# Patient Record
Sex: Female | Born: 1965 | Race: White | Hispanic: No | Marital: Married | Smoking: Never smoker
Health system: Southern US, Community
[De-identification: ages and names within clinical notes are randomized; demographics above are authoritative.]

## PROBLEM LIST (undated history)

## (undated) HISTORY — PX: TUBAL LIGATION: SHX77

---

## 1999-02-05 ENCOUNTER — Other Ambulatory Visit: Admission: RE | Admit: 1999-02-05 | Discharge: 1999-02-05 | Payer: Self-pay | Admitting: Obstetrics and Gynecology

## 1999-08-20 ENCOUNTER — Encounter: Admission: RE | Admit: 1999-08-20 | Discharge: 1999-11-18 | Payer: Self-pay | Admitting: Gynecology

## 2000-01-04 ENCOUNTER — Inpatient Hospital Stay (HOSPITAL_COMMUNITY): Admission: AD | Admit: 2000-01-04 | Discharge: 2000-01-07 | Payer: Self-pay | Admitting: Gynecology

## 2000-01-04 ENCOUNTER — Encounter (INDEPENDENT_AMBULATORY_CARE_PROVIDER_SITE_OTHER): Payer: Self-pay

## 2000-02-17 ENCOUNTER — Other Ambulatory Visit: Admission: RE | Admit: 2000-02-17 | Discharge: 2000-02-17 | Payer: Self-pay | Admitting: Gynecology

## 2001-02-01 HISTORY — PX: ABDOMINOPLASTY: SUR9

## 2001-02-16 ENCOUNTER — Other Ambulatory Visit: Admission: RE | Admit: 2001-02-16 | Discharge: 2001-02-16 | Payer: Self-pay | Admitting: Internal Medicine

## 2001-11-06 ENCOUNTER — Ambulatory Visit (HOSPITAL_COMMUNITY): Admission: RE | Admit: 2001-11-06 | Discharge: 2001-11-06 | Payer: Self-pay | Admitting: Gynecology

## 2002-06-05 ENCOUNTER — Other Ambulatory Visit: Admission: RE | Admit: 2002-06-05 | Discharge: 2002-06-05 | Payer: Self-pay | Admitting: Gynecology

## 2002-07-11 ENCOUNTER — Encounter: Admission: RE | Admit: 2002-07-11 | Discharge: 2002-07-11 | Payer: Self-pay

## 2003-01-10 ENCOUNTER — Encounter: Admission: RE | Admit: 2003-01-10 | Discharge: 2003-01-10 | Payer: Self-pay | Admitting: Gynecology

## 2003-06-13 ENCOUNTER — Other Ambulatory Visit: Admission: RE | Admit: 2003-06-13 | Discharge: 2003-06-13 | Payer: Self-pay | Admitting: Gynecology

## 2004-02-07 ENCOUNTER — Encounter: Admission: RE | Admit: 2004-02-07 | Discharge: 2004-02-07 | Payer: Self-pay | Admitting: Gynecology

## 2004-06-15 ENCOUNTER — Other Ambulatory Visit: Admission: RE | Admit: 2004-06-15 | Discharge: 2004-06-15 | Payer: Self-pay | Admitting: Gynecology

## 2005-03-18 ENCOUNTER — Encounter: Admission: RE | Admit: 2005-03-18 | Discharge: 2005-03-18 | Payer: Self-pay | Admitting: Gynecology

## 2005-11-10 ENCOUNTER — Other Ambulatory Visit: Admission: RE | Admit: 2005-11-10 | Discharge: 2005-11-10 | Payer: Self-pay | Admitting: Gynecology

## 2006-05-02 ENCOUNTER — Encounter: Admission: RE | Admit: 2006-05-02 | Discharge: 2006-05-02 | Payer: Self-pay | Admitting: Gynecology

## 2007-01-06 ENCOUNTER — Other Ambulatory Visit: Admission: RE | Admit: 2007-01-06 | Discharge: 2007-01-06 | Payer: Self-pay | Admitting: Gynecology

## 2007-05-24 ENCOUNTER — Encounter: Admission: RE | Admit: 2007-05-24 | Discharge: 2007-05-24 | Payer: Self-pay | Admitting: Gynecology

## 2007-09-06 ENCOUNTER — Other Ambulatory Visit: Admission: RE | Admit: 2007-09-06 | Discharge: 2007-09-06 | Payer: Self-pay | Admitting: Gynecology

## 2008-01-08 ENCOUNTER — Ambulatory Visit: Payer: Self-pay | Admitting: Women's Health

## 2008-01-08 ENCOUNTER — Encounter: Payer: Self-pay | Admitting: Women's Health

## 2008-01-08 ENCOUNTER — Other Ambulatory Visit: Admission: RE | Admit: 2008-01-08 | Discharge: 2008-01-08 | Payer: Self-pay | Admitting: Gynecology

## 2008-05-24 ENCOUNTER — Encounter: Admission: RE | Admit: 2008-05-24 | Discharge: 2008-05-24 | Payer: Self-pay | Admitting: Gynecology

## 2009-04-02 ENCOUNTER — Other Ambulatory Visit: Admission: RE | Admit: 2009-04-02 | Discharge: 2009-04-02 | Payer: Self-pay | Admitting: Gynecology

## 2009-04-02 ENCOUNTER — Ambulatory Visit: Payer: Self-pay | Admitting: Women's Health

## 2009-05-26 ENCOUNTER — Encounter: Admission: RE | Admit: 2009-05-26 | Discharge: 2009-05-26 | Payer: Self-pay | Admitting: Gynecology

## 2010-04-06 ENCOUNTER — Encounter (INDEPENDENT_AMBULATORY_CARE_PROVIDER_SITE_OTHER): Payer: 59 | Admitting: Women's Health

## 2010-04-06 ENCOUNTER — Other Ambulatory Visit: Payer: Self-pay | Admitting: Women's Health

## 2010-04-06 ENCOUNTER — Other Ambulatory Visit (HOSPITAL_COMMUNITY)
Admission: RE | Admit: 2010-04-06 | Discharge: 2010-04-06 | Disposition: A | Discharge status: Home / Self Care | Payer: 59 | Source: Ambulatory Visit | Attending: Gynecology | Admitting: Gynecology

## 2010-04-06 DIAGNOSIS — N926 Irregular menstruation, unspecified: Secondary | ICD-10-CM

## 2010-04-06 DIAGNOSIS — Z124 Encounter for screening for malignant neoplasm of cervix: Secondary | ICD-10-CM | POA: Insufficient documentation

## 2010-04-06 DIAGNOSIS — N912 Amenorrhea, unspecified: Secondary | ICD-10-CM

## 2010-04-06 DIAGNOSIS — Z01419 Encounter for gynecological examination (general) (routine) without abnormal findings: Secondary | ICD-10-CM

## 2010-04-29 ENCOUNTER — Other Ambulatory Visit: Payer: Self-pay | Admitting: Gynecology

## 2010-04-29 DIAGNOSIS — Z1231 Encounter for screening mammogram for malignant neoplasm of breast: Secondary | ICD-10-CM

## 2010-05-28 ENCOUNTER — Ambulatory Visit: Payer: 59

## 2010-06-19 NOTE — Discharge Summary (Signed)
[facility]  Patient:    Catherine English, Catherine English                           MRN: [id number] Adm. Date:  [id number] Disc. Date: [id number] Attending:  Tonye Royalty Dictator:   Antony Contras, [facility]                           Discharge Summary  DISCHARGE DIAGNOSES:          1. Intrauterine pregnancy at 40+ weeks.                               2. Severe oligohydramnios.                               3. Right renal fetal hydronephrosis, grade 4.  PROCEDURES:                   Low cervical transverse cesarean section with delivery of viable infant secondary to fetal distress, severe bradycardia.  HISTORY OF PRESENT ILLNESS:   Patient is a 45 year old G3, P2, 41 with a corrected EDC of December 24, 1999.  Prenatal risk factors include history of advanced maternal age.  Patient did decline an amniocentesis and MSAFP. Severe oligohydramnios at 41-4/7 weeks.  Also right hydronephrosis grade 4 of the fetal kidney, left renal pelvis otherwise normal.  PRENATAL LABORATORY:          Blood type 0 positive.  Antibody screen negative.  RPR, HBsAg, HIV nonreactive.  Rubella immune.  GBS was positive.  HOSPITAL COURSE:              Patient was admitted at 41-4/7 weeks on January 04, 2000 secondary to severe oligohydramnios.  She was admitted for induction of labor and received GBS prophylaxis.  Amnio infusion also was initiated.  She did experience variable decelerations and also a late acceleration with prolonged bradycardia lasting 6-8 minutes.  Cervical dilatation at this point was 4-5 cm, 90%, -2 station.  It was decided to proceed with cesarean section secondary to fetal distress.  This was performed under epidural anesthesia.  The patient was delivered of an Apgar 9 and 9, 9 pounds 7 ounce female infant.  Pelvic anatomy was normal.  She did experience some mild uterine adenine and was given Methergine 0.2 mg a.m.  POST PARTUM COURSE:           Patient remained  afebrile.  She did complain of some right shoulder pain of which she described as an achy sensation but there was no limitation of movement.  She was able to be discharged on her second postoperative day in satisfactory condition.  DISCHARGE LABORATORY:         CBC:  Hematocrit 32.8, hemoglobin 11.4, WBC 15, 10.9, platelets 146,000.  FOLLOW-UP:                    Six weeks.  DISCHARGE INSTRUCTIONS:       Continue prenatal vitamins and iron.  Motrin and Tylox for pain. DD:  01/29/00 TD:  01/29/00 Job: [id number]

## 2010-06-19 NOTE — Op Note (Signed)
Catherine English, Catherine English                              ACCOUNT NO.:  [id number]   MEDICAL RECORD NO.:  [id number]                   PATIENT TYPE:  AMB   LOCATION:  SDC                                  [facility]ILITY:  [facility]   PHYSICIAN:  Juan H. Lily Peer, M.D.             DATE OF BIRTH:  12-17-1965   DATE OF PROCEDURE:  11/06/2001  DATE OF DISCHARGE:                                 OPERATIVE REPORT   SURGEON:  Juan H. Lily Peer, M.D.   INDICATIONS FOR OPERATION:  A 45 year old gravida 3, para 3 with request for  elective permanent sterilization.   PREOPERATIVE DIAGNOSES:  Request for elective permanent sterilization.   POSTOPERATIVE DIAGNOSES:  Request for elective permanent sterilization.   ANESTHESIA:  General endotracheal anesthesia.   PROCEDURES PERFORMED:  Laparoscopic tubal ligation, Hulka clip  technique/cauterization/transection of fallopian tubes.   FINDINGS:  Cesarean adhesive scar from anterior peritoneum lower pelvis to  cesarean section scar thick and vascular.  Otherwise, normal uterus, tubes,  and ovaries.   COMPLICATIONS:  Mechanical failure with Hulka clip applicator.   DESCRIPTION OF OPERATION:  After the patient was adequately counseled, the  patient was taken to the operating room where she underwent a successful  general endotracheal anesthesia.  She was placed in the low lithotomy  position.  The abdomen, vagina, and perineum were prepped and draped in the  usual sterile fashion.  An examination under anesthesia confirmed an  anteverted uterus with no adnexal masses and a Hulka tenaculum was placed in  an effort to manipulate the uterus during the laparoscopic procedure.  Her  bladder had been evacuated with red rubber Roxan Hockey for approximately 75 cc.  After the drapes were in place a small standard incision was made in the  several umbilical incisions followed by insertion of the Veress needle.  Opening internal abdominal pressure was recorded at 4 mmHg and  approximately  3 L carbon dioxide was insufflated into the vaginal cavity.  The Veress  needle was removed.  The 10 mm trocar was inserted.  The trocar was removed.  The sleeve was left in place.  The laparoscope was inserted and a second  puncture site was made 2 cm above the symphysis pubis approximately two  fingerbreadths in the patient's left midline under laparoscopic guidance.  After a systematic inspection of the pelvis as described above, right  fallopian tube was placed under traction and Hulka clip was placed.  Similar  procedure was attempted on the left fallopian tube but due to a  malfunctioning Hulka clip applicator, the clip was partially occluding the  tube, was removed, and a 2 cm segment of the proximal one-third of fallopian  tube was cauterized and two segments were transected.  Attention was then  placed once again to right fallopian tube and for reassurance distal to the  previously placed clip the fallopian tube was once again cauterized there  and transected in two portions of the fallopian tube as well to ascertain  sterilization.  The pelvic cavity was then copiously irrigated with normal  saline solution.  The instruments were removed.  The 10 mm trocar site  fascia was closed with a GR-6 0 Vicryl suture in a purse-string fashion and  the subcutaneous tissue was approximated with 3-0 Vicryl and the skin was  reapproximated with a Dermbond glue.  The 5 mm trocar sites were  approximated with Dermabond glue as well.  For postoperative analgesia 0.25%  Marcaine was infiltrated in both incision sites for a total of 10 cc.  The  Hulka  tenaculum was removed.  The patient was extubated, transferred to recovery  room with stable vital signs.  She was given 30 mg of Toradol and brought to  the recovery room.  IV fluids were 700 cc.  Urine output was 75 cc in-and-  out catheterization.  Blood loss was minimal.                                               Juan H.  Lily Peer, M.D.    JHF/MEDQ  D:  11/06/2001  T:  11/06/2001  Job:  [id number]

## 2010-06-19 NOTE — Op Note (Signed)
[facility]  Patient:    Catherine English, Catherine English                           MRN: [id number] Proc. Date: 01/04/00 Attending:  Gaetano Hawthorne. Lily Peer, M.D.                           Operative Report  PATIENT #[id number]  SURGEON:                      Gaetano Hawthorne. Lily Peer, M.D.  INDICATIONS:                  A 45 year old gravida 3, para 2, at 41-5/7th weeks gestation, admitted at noon today, secondary to ultrasound-demonstrated severe oligohydramnios with no amniotic fluid.  The patient had undergone an amnioinfusion during labor.  She was on Pitocin.  She had variable decelerations, but with a nice recovery, and eventually had a severe deceleration with bradycardia down to 50-60 beats per minute, despite oxygen administration, lateral positioning, and the administration of terbutaline subcutaneously.  The patients cervix was 4.0 cm, 80%-90% effaced, at a -1 to -2 station.  PREOPERATIVE DIAGNOSIS:       1. Post-date pregnancy at 41-5/7th weeks                                  gestation.                               2. Severe oligohydramnios.                               3. Fetal distress (severe bradycardia).                               4. Positive group-B Streptococcus culture.  POSTOPERATIVE DIAGNOSIS:      1. Post-date pregnancy at 41-5/7th weeks                                  gestation.                               2. Severe oligohydramnios.                               3. Fetal distress (severe bradycardia).                               4. Positive group-B Streptococcus culture.  ANESTHESIA:                   Epidural.  PROCEDURE PERFORMED:           Emergency primary lower uterine segment                               transverse cesarean section.  FINDINGS:  1. Decreased amniotic fluid and clear.                               2. Viable female infant, vertex presentation,                                  nuchal cord tight x 1.                        3. Apgars of 9 and 9 with a weight of 9 pounds                                  and 7 ounces, arterial cord pH 7.22.                               4. Normal maternal pelvic anatomy.                               5. Placenta and cord without any gross                                  anomalies.  DESCRIPTION OF PROCEDURE:     After the patient was counseled verbally en route to the operating room and explained the situation and the persistent fetal bradycardia, she was placed in the low lithotomy position.  She previously had an epidural in place, and a Foley catheter was also in place. Her abdomen was prepped and draped in the usual sterile fashion.  The fetal heart rate was found to be in the 130-beat per minute range.  She underwent another pelvic examination in the operating room.  Her cervix was essentially unchanged.  The information was presented to the mother, now that the heart rate had returned back to 130 at this point; but with a severe oligohydramnios post-dates, it was decided to proceed with a primary cesarean section.  After the drapes were in place, a Pfannenstiel incision was made 2.0 cm above the symphysis pubis.  The incision was carried down through the skin, subcutaneous tissue, down to the rectus fascia, where a midline nick was made.  The fascia was incised in a transverse fashion.  The midline raphe was entered and the peritoneal cavity was entered cautiously.  The bladder flap was established. The lower uterine segment was incised in a transverse fashion.  Very minimal if any amniotic fluid was present and clear.  The fetus was in the vertex presentation.  The head was delivered.  The nuchal cord was reduced, and the newborn was delivered.  The nasopharyngeal area was bulb suctioned.  The newborn gave an immediate cry.  The cord was doubly clamped and excised, and the newborn shown to the parents, and passed out to the neonatologists who were  in attendance.  After the cord blood was obtained, the uterus was delivered from the intrauterine cavity and passed off the table for histological evaluation.  The uterus was then exteriorized.  The uterus was cleared of the remaining products of conception.  Pitocin was initiated, and due to the fact that there was some uterine  atony, 15 minutes after that it was decided to proceed with giving her Methergine 0.2 mg IM during the closure of the transverse incision.  The lower uterine incision was closed with a running stitch of #0 Vicryl suture in a locking stitch manner.  After this the tubes and ovaries were inspected.  All were normal.  The uterus was placed back into the abdominal cavity.  The pelvic cavity was copiously irrigated with normal saline solution.  After ascertaining adequate hemostasis, the rectus fascia was closed with a running stitch of #0 Vicryl suture.  The subcutaneous bleeders were Bovie cauterized.  The skin was reapproximated with skin clips. Dressing with Xeroform gauze and 4 x 4 dressings.  The patient was transferred to the recovery room with stable vital signs.  The blood loss for the procedure was 800 cc.  IV fluids were 2500 cc of lactated Ringers.  Urine output was 300 cc and clear.  Her last penicillin G dose was 2.5 million units IV, that had been administered at 1630 hours due to her history of positive GBS culture. DD:  01/04/00 TD:  01/05/00 Job: [id number]

## 2010-06-19 NOTE — H&P (Signed)
[name], Catherine English                              ACCOUNT NO.:  [id number]   MEDICAL RECORD NO.:  [id number]                   PATIENT TYPE:  AMB   LOCATION:  SDC                                  [facility]   PHYSICIAN:  Juan H. Lily Peer, M.D.             DATE OF BIRTH:  1965-04-26   DATE OF ADMISSION:  11/06/2001  DATE OF DISCHARGE:                                HISTORY & PHYSICAL   CHIEF COMPLAINT:  Request for elective permanent sterilization.   HISTORY:  The patient is a 45 year old gravida 3 para 3 (two normal  spontaneous vaginal deliveries, one  cesarean section).  The patient was  seen in the office on May 17, 2001 for her annual exam and at that time  had decided to continue on Nor-QD since the time that she had been breast  feeding as a result of her postpartum care.  Then she presented to the  office on October 2 for preoperative consultation whereby she wished to  proceed with elective permanent sterilization.  She had previously been  provided with literature information of the [employer] of OB/GYN  outlining risks, benefits, pros and cons, and failure rates from  laparoscopic sterilization procedures and has opted to proceed with such  proceed.   ALLERGIES:  She denies any allergies.   PAST MEDICAL HISTORY:  Two normal spontaneous vaginal deliveries, the last  delivery via cesarean section secondary to oligohydramnios and fetal  distress whereby she had a lower uterine segment transverse cesarean  section.  She has otherwise been healthy.  She has two also additional  children which were adopted and currently not taking any medications.   FAMILY HISTORY:  Maternal grandmother with breast cancer.  The patient with  no medical problems.   PHYSICAL EXAMINATION:  VITAL SIGNS:  The patient weighs 146 pounds, 5 feet 3  inches tall, blood pressure 106/68.  HEENT:  Unremarkable.  NECK:  Supple, trachea midline.  No carotid bruits, no thyromegaly.  LUNGS:   Clear to auscultation without rhonchi or wheezes.  HEART:  Regular rate and rhythm, no murmurs or gallops.  BREAST:  Exam not done.  ABDOMEN:  Soft, nontender, without rebound or guarding.  PELVIC:  Bartholin's, urethra, Skene's are within normal limits.  Vagina and  cervix with no lesion or discharge.  Uterus anteverted, nontender, no mass  or tenderness.  Adnexa with no mass or tenderness.  RECTAL:  Deferred.   ASSESSMENT:  A 45 year old gravida 3 para 3 requesting elective permanent  sterilization.  Risks, benefits, pros and cons of a laparoscopic  sterilization procedure were discussed with the patient on her preoperative  consultation on October 2 whereby risks such as failure rate of 1 in 600 to  1 in 800 were quoted.  Also, potential trauma to internal organs from the  laparoscopic surgery requiring open laparotomy, or the event of  inaccessibility to the  abdominal cavity via the laparoscopic technique and  open laparotomy technique may need to be utilized in effort to gain access  to the pelvic cavity to complete the sterilization procedure.  Also, the  risks for infection; also the risk for hemorrhage, if not contained may  require additional blood products and blood transfusion with its potential  risks of anaphylactic reaction, hepatitis, and AIDS were discussed.  The  patient is also fully aware that this form of sterilization procedure is  permanent; she will no longer be able to have any more children.  She is  content with this decision and will follow accordingly.   PLAN:  The patient is scheduled for a laparoscopic sterilization procedure  on the morning of November 06, 2001 at [facility].                                               [place] H. Lily Peer, M.D.    JHF/MEDQ  D:  11/06/2001  T:  11/06/2001  Job:  [id number]

## 2010-06-22 ENCOUNTER — Ambulatory Visit: Payer: 59

## 2010-07-01 ENCOUNTER — Other Ambulatory Visit (INDEPENDENT_AMBULATORY_CARE_PROVIDER_SITE_OTHER): Payer: 59

## 2010-07-01 DIAGNOSIS — Z5189 Encounter for other specified aftercare: Secondary | ICD-10-CM

## 2010-07-21 ENCOUNTER — Ambulatory Visit: Payer: 59

## 2010-07-29 ENCOUNTER — Ambulatory Visit
Admission: RE | Admit: 2010-07-29 | Discharge: 2010-07-29 | Disposition: A | Discharge status: Home / Self Care | Payer: 59 | Source: Ambulatory Visit | Attending: Gynecology | Admitting: Gynecology

## 2010-07-29 DIAGNOSIS — Z1231 Encounter for screening mammogram for malignant neoplasm of breast: Secondary | ICD-10-CM

## 2011-04-23 NOTE — Progress Notes (Signed)
Patient ID: Catherine English, female   DOB: Aug 16, 1965, 46 y.o.   MRN: [id number] PT. ASKING FOR UTI RX FROM [place]. ADVISED HER [place] NOT HERE AND IS BEST TO BE SEEN BY ANOTHER M.D. Francis Dowse STATES AFTER LEAVING THE TRIAGE MESSAGE SHE IS CURRENTLY FEELING BETTER AND WILL FOLLOW UP WITH [place] IF SYMPTOMS RETURN.

## 2011-04-29 ENCOUNTER — Encounter: Payer: Self-pay | Admitting: Women's Health

## 2011-04-29 ENCOUNTER — Other Ambulatory Visit (HOSPITAL_COMMUNITY)
Admission: RE | Admit: 2011-04-29 | Discharge: 2011-04-29 | Disposition: A | Discharge status: Home / Self Care | Payer: 59 | Source: Ambulatory Visit | Attending: Obstetrics and Gynecology | Admitting: Obstetrics and Gynecology

## 2011-04-29 ENCOUNTER — Ambulatory Visit (INDEPENDENT_AMBULATORY_CARE_PROVIDER_SITE_OTHER): Payer: 59 | Admitting: Women's Health

## 2011-04-29 VITALS — BP 110/60 | Ht 63.75 in | Wt 133.5 lb

## 2011-04-29 DIAGNOSIS — B49 Unspecified mycosis: Secondary | ICD-10-CM

## 2011-04-29 DIAGNOSIS — B379 Candidiasis, unspecified: Secondary | ICD-10-CM

## 2011-04-29 DIAGNOSIS — Z01419 Encounter for gynecological examination (general) (routine) without abnormal findings: Secondary | ICD-10-CM

## 2011-04-29 MED ORDER — FLUCONAZOLE 150 MG PO TABS: 150.0000 mg | ORAL_TABLET | Freq: Once | ORAL | Status: AC

## 2011-04-29 NOTE — Patient Instructions (Signed)

## 2011-04-29 NOTE — Progress Notes (Signed)
Addended by: Venora Maples on: 04/29/2011 11:51 AM   Modules accepted: Orders

## 2011-04-29 NOTE — Progress Notes (Signed)
Catherine English [birth year] [id number]    History:    The patient presents for annual exam.  Monthly 5 day cycle/BTL. States has occasional urinary pressure with occasional stress incontinence with exercise only. States feels like she gets a yeast infection with itching after exercise occasionally. Has lost approximately 30 pounds in the last 2 years with diet and exercise. Denies any itching today. History of normal mammograms and Paps. Had one Pap that showed endometrial cells with a negative endometrial biopsy in 09.   Past medical history, past surgical history, family history and social history were all reviewed and documented in the EPIC chart. Stay-at-home mom.   ROS:  A  ROS was performed and pertinent positives and negatives are included in the history.  Exam:  Filed Vitals:   04/29/11 1036  BP: 110/60    General appearance:  Normal Head/Neck:  Normal, without cervical or supraclavicular adenopathy. Thyroid:  Symmetrical, normal in size, without palpable masses or nodularity. Respiratory  Effort:  Normal  Auscultation:  Clear without wheezing or rhonchi Cardiovascular  Auscultation:  Regular rate, without rubs, murmurs or gallops  Edema/varicosities:  Not grossly evident Abdominal  Soft,nontender, without masses, guarding or rebound.  Liver/spleen:  No organomegaly noted  Hernia:  None appreciated  Skin  Inspection:  Grossly normal  Palpation:  Grossly normal Neurologic/psychiatric  Orientation:  Normal with appropriate conversation.  Mood/affect:  Normal  Genitourinary    Breasts: Examined lying and sitting.     Right: Without masses, retractions, discharge or axillary adenopathy.     Left: Without masses, retractions, discharge or axillary adenopathy.   Inguinal/mons:  Normal without inguinal adenopathy  External genitalia:  Normal  BUS/Urethra/Skene's glands:  Normal  Bladder:  Normal  Vagina:  Normal  Cervix:  Normal  Uterus:  normal in size, shape and  contour.  Midline and mobile  Adnexa/parametria:     Rt: Without masses or tenderness.   Lt: Without masses or tenderness.  Anus and perineum: Normal  Digital rectal exam: Normal sphincter tone without palpated masses or tenderness  Assessment/Plan:  46 y.o. MWF G3 P3 +2 adopted  for annual exam.    Normal GYN exam  Plan: SBE's, annual mammogram, continue healthy exercise routine and healthy diet. Encouraged vitamin D 2000 daily. Prescription for Diflucan 150 given with instructions to take if vaginal itching. CBC, UA and Pap. History of excellent lipid profile, will repeat next year.   Harrington Challenger [facility], 11:32 AM 04/29/2011

## 2011-04-30 LAB — URINALYSIS W MICROSCOPIC + REFLEX CULTURE
Bacteria, UA: NONE SEEN
Bilirubin Urine: NEGATIVE
Casts: NONE SEEN
Glucose, UA: NEGATIVE mg/dL
Hgb urine dipstick: NEGATIVE
Ketones, ur: NEGATIVE mg/dL
Leukocytes, UA: NEGATIVE
Nitrite: NEGATIVE
Protein, ur: NEGATIVE mg/dL
Specific Gravity, Urine: 1.026 (ref 1.005–1.030)
Squamous Epithelial / LPF: NONE SEEN
Urobilinogen, UA: 0.2 mg/dL (ref 0.0–1.0)
pH: 5.5 (ref 5.0–8.0)

## 2011-08-30 ENCOUNTER — Other Ambulatory Visit: Payer: Self-pay | Admitting: Gynecology

## 2011-08-30 DIAGNOSIS — Z1231 Encounter for screening mammogram for malignant neoplasm of breast: Secondary | ICD-10-CM

## 2011-09-10 ENCOUNTER — Ambulatory Visit
Admission: RE | Admit: 2011-09-10 | Discharge: 2011-09-10 | Disposition: A | Discharge status: Home / Self Care | Payer: 59 | Source: Ambulatory Visit | Attending: Gynecology | Admitting: Gynecology

## 2011-09-10 DIAGNOSIS — Z1231 Encounter for screening mammogram for malignant neoplasm of breast: Secondary | ICD-10-CM

## 2012-10-16 ENCOUNTER — Other Ambulatory Visit: Payer: Self-pay

## 2012-10-16 DIAGNOSIS — Z1231 Encounter for screening mammogram for malignant neoplasm of breast: Secondary | ICD-10-CM

## 2012-11-06 ENCOUNTER — Ambulatory Visit
Admission: RE | Admit: 2012-11-06 | Discharge: 2012-11-06 | Disposition: A | Discharge status: Home / Self Care | Payer: 59 | Source: Ambulatory Visit

## 2012-11-06 DIAGNOSIS — Z1231 Encounter for screening mammogram for malignant neoplasm of breast: Secondary | ICD-10-CM

## 2013-01-12 ENCOUNTER — Ambulatory Visit (INDEPENDENT_AMBULATORY_CARE_PROVIDER_SITE_OTHER): Payer: 59 | Admitting: Women's Health

## 2013-01-12 ENCOUNTER — Encounter: Payer: Self-pay | Admitting: Women's Health

## 2013-01-12 VITALS — BP 106/62 | Ht 62.5 in | Wt 140.0 lb

## 2013-01-12 DIAGNOSIS — Z833 Family history of diabetes mellitus: Secondary | ICD-10-CM

## 2013-01-12 DIAGNOSIS — Z13220 Encounter for screening for lipoid disorders: Secondary | ICD-10-CM

## 2013-01-12 DIAGNOSIS — Z01419 Encounter for gynecological examination (general) (routine) without abnormal findings: Secondary | ICD-10-CM

## 2013-01-12 DIAGNOSIS — N912 Amenorrhea, unspecified: Secondary | ICD-10-CM

## 2013-01-12 LAB — CBC WITH DIFFERENTIAL/PLATELET
Basophils Absolute: 0 10*3/uL (ref 0.0–0.1)
Basophils Relative: 1 % (ref 0–1)
Eosinophils Absolute: 0.3 10*3/uL (ref 0.0–0.7)
Eosinophils Relative: 4 % (ref 0–5)
HCT: 39.4 % (ref 36.0–46.0)
Hemoglobin: 13.6 g/dL (ref 12.0–15.0)
Lymphocytes Relative: 41 % (ref 12–46)
Lymphs Abs: 2.8 10*3/uL (ref 0.7–4.0)
MCH: 30.7 pg (ref 26.0–34.0)
MCHC: 34.5 g/dL (ref 30.0–36.0)
MCV: 88.9 fL (ref 78.0–100.0)
Monocytes Absolute: 0.5 10*3/uL (ref 0.1–1.0)
Monocytes Relative: 7 % (ref 3–12)
Neutro Abs: 3.3 10*3/uL (ref 1.7–7.7)
Neutrophils Relative %: 47 % (ref 43–77)
Platelets: 208 10*3/uL (ref 150–400)
RBC: 4.43 MIL/uL (ref 3.87–5.11)
RDW: 13.8 % (ref 11.5–15.5)
WBC: 6.9 10*3/uL (ref 4.0–10.5)

## 2013-01-12 LAB — LIPID PANEL
Cholesterol: 205 mg/dL — ABNORMAL HIGH (ref 0–200)
HDL: 64 mg/dL (ref >39)
LDL Cholesterol: 125 mg/dL — ABNORMAL HIGH (ref 0–99)
Total CHOL/HDL Ratio: 3.2 Ratio
Triglycerides: 81 mg/dL (ref <150)
VLDL: 16 mg/dL (ref 0–40)

## 2013-01-12 LAB — FOLLICLE STIMULATING HORMONE: FSH: 64.8 m[IU]/mL

## 2013-01-12 LAB — GLUCOSE, RANDOM: Glucose, Bld: 80 mg/dL (ref 70–99)

## 2013-01-12 NOTE — Progress Notes (Signed)
Catherine English 12/05/1965 [id number]    History:    The patient presents for annual exam.  Cycles irregular this past year, last cycle October, prior cycles every month. BTL. Pap 2009 endometrial cells with a negative biopsy. Normal mammograms.   Past medical history, past surgical history, family history and social history were all reviewed and documented in the EPIC chart. 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 Oldest son [employer], second son at [facility], daughter at [facility], 59 and 49 year old at home. Mother osteoporosis.MGM breast cancer.  ROS:  A  ROS was performed and pertinent positives and negatives are included in the history.  Exam:  Filed Vitals:   01/12/13 1608  BP: 106/62    General appearance:  Normal Head/Neck:  Normal, without cervical or supraclavicular adenopathy. Thyroid:  Symmetrical, normal in size, without palpable masses or nodularity. Respiratory  Effort:  Normal  Auscultation:  Clear without wheezing or rhonchi Cardiovascular  Auscultation:  Regular rate, without rubs, murmurs or gallops  Edema/varicosities:  Not grossly evident Abdominal  Soft,nontender, without masses, guarding or rebound.  Liver/spleen:  No organomegaly noted  Hernia:  None appreciated  Skin  Inspection:  Grossly normal  Palpation:  Grossly normal Neurologic/psychiatric  Orientation:  Normal with appropriate conversation.  Mood/affect:  Normal  Genitourinary    Breasts: Examined lying and sitting.     Right: Without masses, retractions, discharge or axillary adenopathy.     Left: Without masses, retractions, discharge or axillary adenopathy.   Inguinal/mons:  Normal without inguinal adenopathy  External genitalia:  Normal  BUS/Urethra/Skene's glands:  Normal  Bladder:  Normal  Vagina:  Normal  Cervix:  Normal  Uterus:   normal in size, shape and contour.  Midline and mobile  Adnexa/parametria:     Rt: Without masses or tenderness.   Lt: Without masses or  tenderness.  Anus and perineum: Normal  Digital rectal exam: Normal sphincter tone without palpated masses or tenderness  Assessment/Plan:  47 y.o. MWF G3P3 for annual exam with no complaints.  Perimenopausal/BTL  Plan: FSH, if not menopausal withdrawal with Provera. SBE's, continue annual mammogram, calcium rich diet, vitamin D 2000 daily and regular exercise encouraged. CBC, glucose, lipid panel, UA, Pap normal 2013, new screening guidelines reviewed.  Harrington Challenger [facility], 4:35 PM 01/12/2013

## 2013-01-12 NOTE — Patient Instructions (Signed)

## 2013-01-13 LAB — URINALYSIS W MICROSCOPIC + REFLEX CULTURE
Bacteria, UA: NONE SEEN
Bilirubin Urine: NEGATIVE
Casts: NONE SEEN
Glucose, UA: NEGATIVE mg/dL
Hgb urine dipstick: NEGATIVE
Leukocytes, UA: NEGATIVE
Nitrite: NEGATIVE
Protein, ur: NEGATIVE mg/dL
Specific Gravity, Urine: 1.03 (ref 1.005–1.030)
Urobilinogen, UA: 0.2 mg/dL (ref 0.0–1.0)
pH: 5.5 (ref 5.0–8.0)

## 2013-12-03 ENCOUNTER — Encounter: Payer: Self-pay | Admitting: Women's Health

## 2015-12-30 ENCOUNTER — Other Ambulatory Visit: Payer: Self-pay | Admitting: Women's Health

## 2015-12-30 DIAGNOSIS — Z1231 Encounter for screening mammogram for malignant neoplasm of breast: Secondary | ICD-10-CM

## 2016-01-12 ENCOUNTER — Emergency Department (HOSPITAL_COMMUNITY)
Admission: EM | Admit: 2016-01-12 | Discharge: 2016-01-12 | Disposition: A | Discharge status: Home / Self Care | Payer: No Typology Code available for payment source | Attending: Emergency Medicine | Admitting: Emergency Medicine

## 2016-01-12 ENCOUNTER — Emergency Department (HOSPITAL_COMMUNITY): Payer: No Typology Code available for payment source

## 2016-01-12 ENCOUNTER — Encounter (HOSPITAL_COMMUNITY): Payer: Self-pay

## 2016-01-12 DIAGNOSIS — R0789 Other chest pain: Secondary | ICD-10-CM | POA: Insufficient documentation

## 2016-01-12 DIAGNOSIS — Z79899 Other long term (current) drug therapy: Secondary | ICD-10-CM | POA: Insufficient documentation

## 2016-01-12 DIAGNOSIS — R079 Chest pain, unspecified: Secondary | ICD-10-CM

## 2016-01-12 LAB — CBC
HCT: 41.3 % (ref 36.0–46.0)
Hemoglobin: 14.2 g/dL (ref 12.0–15.0)
MCH: 30.1 pg (ref 26.0–34.0)
MCHC: 34.4 g/dL (ref 30.0–36.0)
MCV: 87.5 fL (ref 78.0–100.0)
Platelets: 221 10*3/uL (ref 150–400)
RBC: 4.72 MIL/uL (ref 3.87–5.11)
RDW: 12.6 % (ref 11.5–15.5)
WBC: 6.6 10*3/uL (ref 4.0–10.5)

## 2016-01-12 LAB — BASIC METABOLIC PANEL
Anion gap: 10 (ref 5–15)
BUN: 19 mg/dL (ref 6–20)
CO2: 27 mmol/L (ref 22–32)
Calcium: 9.5 mg/dL (ref 8.9–10.3)
Chloride: 102 mmol/L (ref 101–111)
Creatinine, Ser: 0.72 mg/dL (ref 0.44–1.00)
GFR calc Af Amer: >60 mL/min (ref >60)
GFR calc non Af Amer: >60 mL/min (ref >60)
Glucose, Bld: 101 mg/dL — ABNORMAL HIGH (ref 65–99)
Potassium: 4.1 mmol/L (ref 3.5–5.1)
Sodium: 139 mmol/L (ref 135–145)

## 2016-01-12 LAB — I-STAT TROPONIN, ED: Troponin i, poc: 0 ng/mL (ref 0.00–0.08)

## 2016-01-12 MED ORDER — MORPHINE SULFATE (PF) 4 MG/ML IV SOLN: 4.0000 mg | INTRAVENOUS | Status: DC | PRN

## 2016-01-12 MED ORDER — ONDANSETRON HCL 4 MG/2ML IJ SOLN: 4.0000 mg | Freq: Once | INTRAMUSCULAR | Status: DC

## 2016-01-12 MED ORDER — OMEPRAZOLE 20 MG PO CPDR: 20.0000 mg | DELAYED_RELEASE_CAPSULE | Freq: Every day | ORAL | 0 refills | Status: DC

## 2016-01-12 NOTE — ED Provider Notes (Signed)
[facility]-EMER[facility]ENCY DEPT Provider Note   CSN: [id number] Arrival date & time: 01/12/16  1334     History   Chief Complaint Chief Complaint  Patient presents with  . Chest Pain    HPI Catherine English is a 50 y.o. female presents to the ED with complaints of chest pain for 2 weeks. He states that she's had this constant, non changing, nagging, pressure, and her central chest, with no radiation, and severity of 2/10. She states nothing makes it better nothing makes it worse. She states is not tender to palpation. She states that she's never had anything like this before. She denies diaphoresis, radiation to jaw, arms, or back. She states that she went to urgent care this morning and was referred to ED for further evaluation. Patient denies any history of ACS, PE, GERD, or change in activity. Patient denies shortness of breath, fevers, chills, nausea, vomiting, urinary symptoms, changes in bowel movements.  HPI  History reviewed. No pertinent past medical history.  There are no active problems to display for this patient.   Past Surgical History:  Procedure Laterality Date  . ABDOMINOPLASTY  2003  . CESAREAN SECTION  11/2001   with bilateral tubal ligation  . TUBAL LIGATION      OB History    Gravida Para Term Preterm AB Living   3 3       3    SAB TAB Ectopic Multiple Live Births                   Home Medications    Prior to Admission medications   Medication Sig Start Date End Date Taking? Authorizing Provider  omeprazole (PRILOSEC) 20 MG capsule Take 1 capsule (20 mg total) by mouth daily. 01/12/16   Oday Ridings Orson AloeManuel Dandy Lazaro, [place]    Family History Family History  Problem Relation Age of Onset  . Osteoporosis Mother   . Breast cancer Maternal Grandmother     Social History Social History  Substance Use Topics  . Smoking status: Never Smoker  . Smokeless tobacco: Never Used  . Alcohol use No     Allergies   Patient has no known allergies.   Review of  Systems Review of Systems  Constitutional: Negative for appetite change, chills, diaphoresis and fever.  HENT: Negative for ear pain and sore throat.   Eyes: Negative for pain and visual disturbance.  Respiratory: Negative for cough and shortness of breath.   Cardiovascular: Positive for chest pain. Negative for palpitations.  Gastrointestinal: Negative for abdominal pain, constipation, diarrhea, nausea and vomiting.  Genitourinary: Negative for dysuria and hematuria.  Musculoskeletal: Negative for arthralgias and back pain.  Skin: Negative for color change and rash.  Neurological: Negative for syncope.  All other systems reviewed and are negative.    Physical Exam Updated Vital Signs BP 110/71   Pulse 71   Temp 98.2 F (36.8 C) (Oral)   Resp 17   Ht 5\' 4"  (1.626 m)   Wt 77.6 kg   SpO2 98%   BMI 29.37 kg/m   Physical Exam  Constitutional: She is oriented to person, place, and time. She appears well-developed and well-nourished.  HENT:  Head: Normocephalic and atraumatic.  Nose: Nose normal.  Mouth/Throat: Oropharynx is clear and moist.  Eyes: Conjunctivae and EOM are normal. Pupils are equal, round, and reactive to light.  Neck: Normal range of motion. Neck supple.  Cardiovascular: Normal rate, normal heart sounds and intact distal pulses.   Pulmonary/Chest: Effort normal and breath  sounds normal. No respiratory distress. She exhibits no tenderness.  Abdominal: Soft. There is no tenderness. There is no rebound and no guarding.  Musculoskeletal: Normal range of motion.  Neurological: She is alert and oriented to person, place, and time.  Skin: Skin is warm. Capillary refill takes less than 2 seconds.  Psychiatric: She has a normal mood and affect. Her behavior is normal.  Nursing note and vitals reviewed.    ED Treatments / Results  Labs (all labs ordered are listed, but only abnormal results are displayed) Labs Reviewed  BASIC METABOLIC PANEL - Abnormal; Notable  for the following:       Result Value   Glucose, Bld 101 (*)    All other components within normal limits  CBC  I-STAT TROPOININ, ED    EKG  EKG Interpretation  Date/Time:  Monday January 12 2016 13:39:19 EST Ventricular Rate:  78 PR Interval:  152 QRS Duration: 78 QT Interval:  360 QTC Calculation: 410 R Axis:   73 Text Interpretation:  Normal sinus rhythm Cannot rule out Anterior infarct , age undetermined Abnormal ECG No old tracing to compare Confirmed by Juleen China  MD, STEPHEN [phone]) on 01/12/2016 4:54:19 PM       Radiology Dg Chest 2 View  Result Date: 01/12/2016 CLINICAL DATA:  Mid chest heaviness which began 2 weeks ago. Nonsmoker. EXAM: CHEST  2 VIEW COMPARISON:  None in PACs FINDINGS: The lungs are mildly hyperinflated. There is no focal infiltrate. There is no pleural effusion. The heart and pulmonary vascularity are normal. The mediastinum is normal in width. The bony thorax exhibits no acute abnormality. IMPRESSION: Mild hyperinflation may be voluntary or may reflect air trapping possibly secondary to bronchitis. There is no acute cardiopulmonary abnormality. Electronically Signed   By: David  [place] M.D.   On: 01/12/2016 14:52    Procedures Procedures (including critical care time)  Medications Ordered in ED Medications - No data to display   Initial Impression / Assessment and Plan / ED Course  I have reviewed the triage vital signs and the nursing notes.  Pertinent labs & imaging results that were available during my care of the patient were reviewed by me and considered in my medical decision making (see chart for details).  Clinical Course   Pt is a 50yo female presents with CP for 2 weeks. Does not have any risk Factors . Patient is to be discharged with recommendation to follow up with PCP in regards to today's hospital visit. Chest pain is not likely of cardiac or pulmonary etiology d/t presentation, Wells score of 0 for PE, VSS, no tracheal deviation, no  JVD or new murmur, RRR, breath sounds equal bilaterally, EKG without acute abnormalities, negative troponin, and negative CXR. Heart Score 1. Pt has been advised start a PPI and return to the ED if CP becomes exertional, associated with diaphoresis or nausea, radiates to left jaw/arm, worsens or becomes concerning in any way. Pt appears reliable for follow up and is agreeable to discharge. Patient is in no acute distress. Vital Signs are stable. Patient is able to ambulate. Patient able to tolerate PO.   Final Clinical Impressions(s) / ED Diagnoses   Final diagnoses:  Nonspecific chest pain    New Prescriptions Discharge Medication List as of 01/12/2016  5:24 PM    START taking these medications   Details  omeprazole (PRILOSEC) 20 MG capsule Take 1 capsule (20 mg total) by mouth daily., Starting Mon 01/12/2016, Print  [address], [place] 01/12/16 2212    Raeford RazorStephen Kohut, MD 01/14/16 [phone]

## 2016-01-12 NOTE — Discharge Instructions (Signed)
Please schedule appointment with [facility] in 2-5 days for follow-up. Please take omeprazole 30 minutes before breakfast each day.  Get help right away if: Your chest pain is worse. You have a cough that gets worse, or you cough up blood. You have severe pain in your abdomen. You have severe weakness. You faint. You have sudden, unexplained chest discomfort. You have sudden, unexplained discomfort in your arms, back, neck, or jaw. You have shortness of breath at any time. You suddenly start to sweat, or your skin gets clammy. You feel nauseous or you vomit. You suddenly feel light-headed or dizzy. Your heart begins to beat quickly, or it feels like it is skipping beats.

## 2016-01-12 NOTE — ED Notes (Signed)
EDP at bedside  

## 2016-01-12 NOTE — ED Triage Notes (Signed)
Per pT, Pt is coming from home with complaints of central chest heaviness that started two weeks ago. Denies any N/V/D, SOB, or back pain. Denies dizziness or blurred vision.

## 2016-01-14 ENCOUNTER — Ambulatory Visit: Payer: Self-pay | Admitting: Women's Health

## 2017-02-23 ENCOUNTER — Other Ambulatory Visit: Payer: Self-pay | Admitting: Obstetrics and Gynecology

## 2017-02-23 DIAGNOSIS — Z1231 Encounter for screening mammogram for malignant neoplasm of breast: Secondary | ICD-10-CM

## 2017-03-07 ENCOUNTER — Ambulatory Visit: Payer: Self-pay | Admitting: Women's Health

## 2017-03-22 ENCOUNTER — Encounter (HOSPITAL_COMMUNITY): Payer: Self-pay

## 2017-03-22 ENCOUNTER — Ambulatory Visit (HOSPITAL_COMMUNITY)
Admission: RE | Admit: 2017-03-22 | Discharge: 2017-03-22 | Disposition: A | Discharge status: Home / Self Care | Payer: No Typology Code available for payment source | Source: Ambulatory Visit | Attending: Obstetrics and Gynecology | Admitting: Obstetrics and Gynecology

## 2017-03-22 ENCOUNTER — Ambulatory Visit
Admission: RE | Admit: 2017-03-22 | Discharge: 2017-03-22 | Disposition: A | Discharge status: Home / Self Care | Payer: No Typology Code available for payment source | Source: Ambulatory Visit | Attending: Obstetrics and Gynecology | Admitting: Obstetrics and Gynecology

## 2017-03-22 VITALS — BP 110/72 | Ht 64.0 in | Wt 172.0 lb

## 2017-03-22 DIAGNOSIS — Z1231 Encounter for screening mammogram for malignant neoplasm of breast: Secondary | ICD-10-CM

## 2017-03-22 DIAGNOSIS — Z01419 Encounter for gynecological examination (general) (routine) without abnormal findings: Secondary | ICD-10-CM

## 2017-03-22 NOTE — Addendum Note (Signed)
Encounter addended by: Priscille HeidelbergBrannock, Haydan Wedig P, RN on: 03/22/2017 3:00 PM  Actions taken: Sign clinical note

## 2017-03-22 NOTE — Addendum Note (Signed)
Encounter addended by: Priscille HeidelbergBrannock, Lief Palmatier P, RN on: 03/22/2017 2:55 PM  Actions taken: Sign clinical note

## 2017-03-22 NOTE — Patient Instructions (Addendum)
Explained breast self awareness with [employer] Allum. Let patient know [facility] will cover Pap smears and HPV typing every 5 years unless has a history of abnormal Pap smears. [facility]. Appointment scheduled for Tuesday, March 22, 2017 at 1500. Patient aware of appointment and will be there. Let patient know will follow up with her within the next couple weeks with results of Pap smear by letter or phone. Informed patient that the Breast Center will follow-up with her within the next couple of weeks with results of mammogram by letter or phone. Catherine English verbalized understanding.  Brannock, [name], RN 1:24 PM

## 2017-03-22 NOTE — Progress Notes (Addendum)
No complaints today.   Pap Smear: Pap smear completed today. [facility] and normal. Per patient has no history of an abnormal Pap smear. Last two Pap smear results are in Epic.  Physical exam: Breasts Breasts symmetrical. No skin abnormalities bilateral breasts. No nipple retraction bilateral breasts. No nipple discharge bilateral breasts. No lymphadenopathy. No lumps palpated bilateral breasts. No complaints of pain or tenderness on exam. [facility] mamm[facility]. Appointment scheduled for Tuesday, March 22, 2017 at 1500.        Pelvic/Bimanual   Ext Genitalia No lesions, no swelling and no discharge observed on external genitalia.         Vagina Vagina pink and normal texture. No lesions or discharge observed in vagina.          Cervix Cervix is present. Cervix pink and of normal texture. No discharge observed.     Uterus Uterus is present and palpable. Uterus in normal position and normal size.        Adnexae Bilateral ovaries present and palpable. No tenderness on palpation.         Rectovaginal No rectal exam completed today since patient had no rectal complaints. No skin abnormalities observed on exam.    Smoking History: Patient has never smoked.  Patient Navigation: Patient education provided. Access to services provided for patient through [facility] program.   Colorectal Cancer Screening: Per patient has never had a colonoscopy completed. No complaints today. FIT Test given to patient to complete and return to [facility].  Breast and Cervical Cancer Risk Assessment: Patient's material grandmother has a history of breast cancer. Patient has no known genetic mutations, or radiation treatment to the chest before age 54. Patient has no history of cervical dysplasia, immunocompromised, or DES exposure in-utero.

## 2017-03-25 LAB — CYTOLOGY - PAP
Diagnosis: UNDETERMINED — AB
HPV: NOT DETECTED

## 2017-03-28 ENCOUNTER — Other Ambulatory Visit: Payer: Self-pay

## 2017-03-31 LAB — FECAL OCCULT BLOOD, IMMUNOCHEMICAL: FECAL OCCULT BLD: NEGATIVE

## 2018-01-18 LAB — HM HEPATITIS C SCREENING LAB: HM Hepatitis Screen: NEGATIVE

## 2018-01-18 LAB — HM HIV SCREENING LAB: HM HIV Screening: NEGATIVE

## 2018-05-19 IMAGING — DX DG CHEST 2V
2 series · 2 of 2 positions shown · non-contrast
Comparison: None in PACs

CLINICAL DATA: Mid chest heaviness which began 2 weeks ago.
Nonsmoker.

EXAM:
CHEST  2 VIEW

[chest pa]
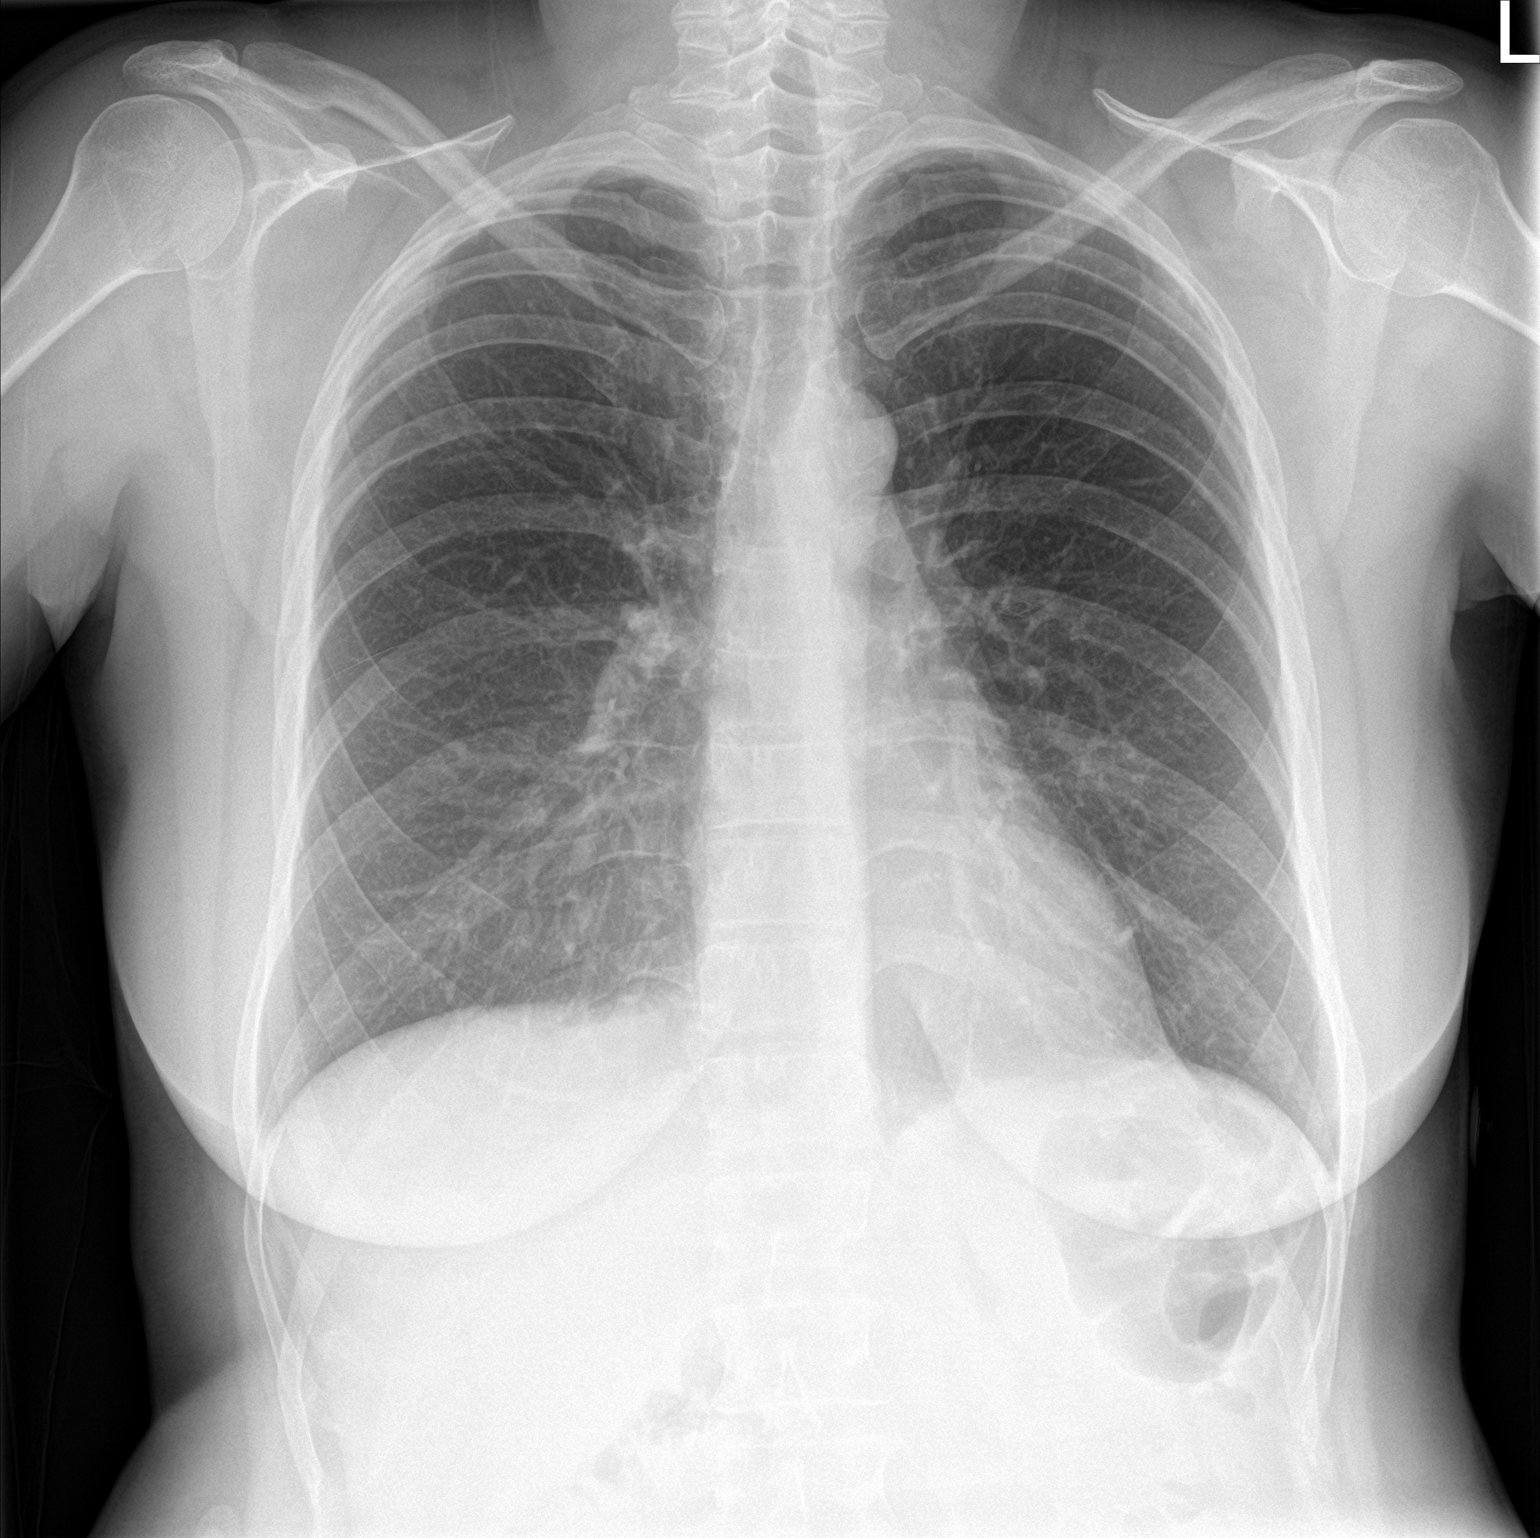

[chest lat]
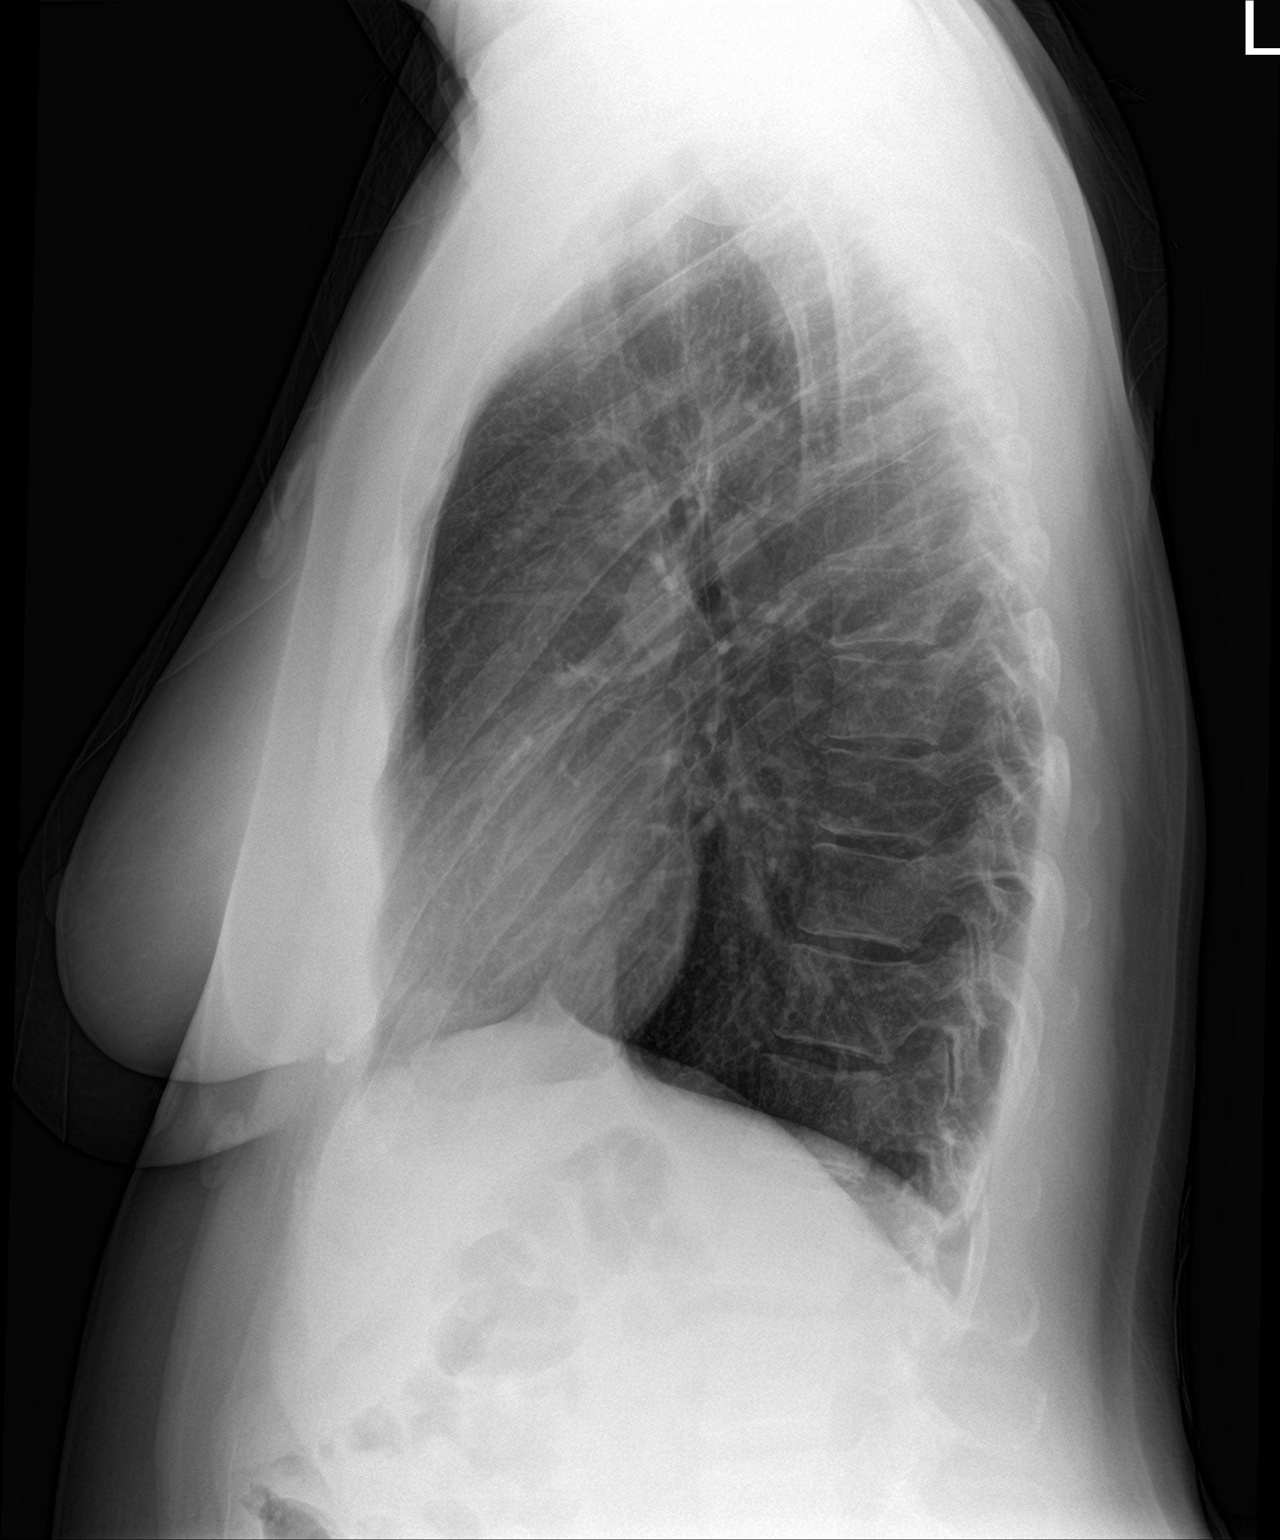

[2 of 2 positions shown; findings below may reference images not displayed]

FINDINGS: The lungs are mildly hyperinflated. There is no focal infiltrate.
There is no pleural effusion. The heart and pulmonary vascularity
are normal. The mediastinum is normal in width. The bony thorax
exhibits no acute abnormality.
IMPRESSION: Mild hyperinflation may be voluntary or may reflect air trapping
possibly secondary to bronchitis. There is no acute cardiopulmonary
abnormality.

## 2018-11-28 ENCOUNTER — Other Ambulatory Visit: Payer: Self-pay

## 2018-11-29 ENCOUNTER — Other Ambulatory Visit: Payer: Self-pay

## 2018-11-29 ENCOUNTER — Encounter: Payer: Self-pay | Admitting: Family Medicine

## 2018-11-29 ENCOUNTER — Ambulatory Visit (INDEPENDENT_AMBULATORY_CARE_PROVIDER_SITE_OTHER): Payer: No Typology Code available for payment source | Admitting: Family Medicine

## 2018-11-29 VITALS — BP 112/68 | HR 78 | Temp 98.4°F | Ht 64.0 in | Wt 170.8 lb

## 2018-11-29 DIAGNOSIS — Z1211 Encounter for screening for malignant neoplasm of colon: Secondary | ICD-10-CM

## 2018-11-29 DIAGNOSIS — Z0000 Encounter for general adult medical examination without abnormal findings: Secondary | ICD-10-CM | POA: Diagnosis not present

## 2018-11-29 NOTE — Patient Instructions (Addendum)
Please bring your lab work!   Preventive Care 29-55 Years Old, Female Preventive care refers to visits with your health care provider and lifestyle choices that can promote health and wellness. This includes:  A yearly physical exam. This may also be called an annual well check.  Regular dental visits and eye exams.  Immunizations.  Screening for certain conditions.  Healthy lifestyle choices, such as eating a healthy diet, getting regular exercise, not using drugs or products that contain nicotine and tobacco, and limiting alcohol use. What can I expect for my preventive care visit? Physical exam Your health care provider will check your:  Height and weight. This may be used to calculate body mass index (BMI), which tells if you are at a healthy weight.  Heart rate and blood pressure.  Skin for abnormal spots. Counseling Your health care provider may ask you questions about your:  Alcohol, tobacco, and drug use.  Emotional well-being.  Home and relationship well-being.  Sexual activity.  Eating habits.  Work and work Statistician.  Method of birth control.  Menstrual cycle.  Pregnancy history. What immunizations do I need?  Influenza (flu) vaccine  This is recommended every year. Tetanus, diphtheria, and pertussis (Tdap) vaccine  You may need a Td booster every 10 years. Varicella (chickenpox) vaccine  You may need this if you have not been vaccinated. Zoster (shingles) vaccine  You may need this after age 45. Measles, mumps, and rubella (MMR) vaccine  You may need at least one dose of MMR if you were born in 1957 or later. You may also need a second dose. Pneumococcal conjugate (PCV13) vaccine  You may need this if you have certain conditions and were not previously vaccinated. Pneumococcal polysaccharide (PPSV23) vaccine  You may need one or two doses if you smoke cigarettes or if you have certain conditions. Meningococcal conjugate (MenACWY)  vaccine  You may need this if you have certain conditions. Hepatitis A vaccine  You may need this if you have certain conditions or if you travel or work in places where you may be exposed to hepatitis A. Hepatitis B vaccine  You may need this if you have certain conditions or if you travel or work in places where you may be exposed to hepatitis B. Haemophilus influenzae type b (Hib) vaccine  You may need this if you have certain conditions. Human papillomavirus (HPV) vaccine  If recommended by your health care provider, you may need three doses over 6 months. You may receive vaccines as individual doses or as more than one vaccine together in one shot (combination vaccines). Talk with your health care provider about the risks and benefits of combination vaccines. What tests do I need? Blood tests  Lipid and cholesterol levels. These may be checked every 5 years, or more frequently if you are over 21 years old.  Hepatitis C test.  Hepatitis B test. Screening  Lung cancer screening. You may have this screening every year starting at age 32 if you have a 30-pack-year history of smoking and currently smoke or have quit within the past 15 years.  Colorectal cancer screening. All adults should have this screening starting at age 14 and continuing until age 60. Your health care provider may recommend screening at age 77 if you are at increased risk. You will have tests every 1-10 years, depending on your results and the type of screening test.  Diabetes screening. This is done by checking your blood sugar (glucose) after you have not eaten for  a while (fasting). You may have this done every 1-3 years.  Mammogram. This may be done every 1-2 years. Talk with your health care provider about when you should start having regular mammograms. This may depend on whether you have a family history of breast cancer.  BRCA-related cancer screening. This may be done if you have a family history of  breast, ovarian, tubal, or peritoneal cancers.  Pelvic exam and Pap test. This may be done every 3 years starting at age 21. Starting at age 30, this may be done every 5 years if you have a Pap test in combination with an HPV test. Other tests  Sexually transmitted disease (STD) testing.  Bone density scan. This is done to screen for osteoporosis. You may have this scan if you are at high risk for osteoporosis. Follow these instructions at home: Eating and drinking  Eat a diet that includes fresh fruits and vegetables, whole grains, lean protein, and low-fat dairy.  Take vitamin and mineral supplements as recommended by your health care provider.  Do not drink alcohol if: ? Your health care provider tells you not to drink. ? You are pregnant, may be pregnant, or are planning to become pregnant.  If you drink alcohol: ? Limit how much you have to 0-1 drink a day. ? Be aware of how much alcohol is in your drink. In the [place] one drink equals one 12 oz bottle of beer (355 mL), one 5 oz glass of wine (148 mL), or one 1 oz glass of hard liquor (44 mL). Lifestyle  Take daily care of your teeth and gums.  Stay active. Exercise for at least 30 minutes on 5 or more days each week.  Do not use any products that contain nicotine or tobacco, such as cigarettes, e-cigarettes, and chewing tobacco. If you need help quitting, ask your health care provider.  If you are sexually active, practice safe sex. Use a condom or other form of birth control (contraception) in order to prevent pregnancy and STIs (sexually transmitted infections).  If told by your health care provider, take low-dose aspirin daily starting at age 50. What's next?  Visit your health care provider once a year for a well check visit.  Ask your health care provider how often you should have your eyes and teeth checked.  Stay up to date on all vaccines. This information is not intended to replace advice given to you by your  health care provider. Make sure you discuss any questions you have with your health care provider. Document Released: 02/14/2015 Document Revised: 09/29/2017 Document Reviewed: 09/29/2017 Elsevier Patient Education  2020 Elsevier Inc.  

## 2018-11-29 NOTE — Progress Notes (Signed)
New Patient Office Visit  Assessment & Plan:  1. Well adult exam - Preventive care education provided. Patient declined TDAP, Shingrix, and influenza vaccines. Her most recent pap smear was in 2019 - she did have ASCUS but HPV was negative. Her last mammogram was 2019 and she would like to remain on a two year plan. Mammogram scheduled for the bus in February. Declined HIV and Hep C screening.   2. Colon cancer screening - Cologuard   Follow-up: Return in about 1 year (around 11/29/2019) for annual physical.   Deliah Boston, MSN, APRN, FNP-C [facility]  Subjective:  Patient ID: Catherine English, female    DOB: 1965/12/03  Age: 53 y.o. MRN: [id number]  Patient Care Team: Gwenlyn Fudge, FNP as PCP - General (Family Medicine)  CC:  Chief Complaint  Patient presents with  . New Patient (Initial Visit)    HPI Catherine English presents to establish care. Patient reports she sees a holistic doctor in [place] but wants to get established with a PCP in the [facility] system.   She reports she recently had an episode where she was unable to take a deep breath for about five days. She went to an urgent care where had an x-ray and was told she has fluid on her right lung. Her COVID-19 test result was negative. She was advised to f/u with pulmonology. She was unable to get an appointment without being established with PCP. Her symptoms have now resolved. She no longer would like the referral.    Review of Systems  Constitutional: Negative for chills, fever, malaise/fatigue and weight loss.  HENT: Negative for congestion, ear discharge, ear pain, nosebleeds, sinus pain, sore throat and tinnitus.   Eyes: Negative for blurred vision, double vision, pain, discharge and redness.  Respiratory: Negative for cough, shortness of breath and wheezing.   Cardiovascular: Negative for chest pain, palpitations and leg swelling.  Gastrointestinal: Negative for abdominal pain,  constipation, diarrhea, heartburn, nausea and vomiting.  Genitourinary: Negative for dysuria, frequency and urgency.  Musculoskeletal: Negative for myalgias.  Skin: Negative for rash.  Neurological: Negative for dizziness, seizures, weakness and headaches.  Psychiatric/Behavioral: Negative for depression, substance abuse and suicidal ideas. The patient is not nervous/anxious.    No current outpatient medications on file.  No Known Allergies  History reviewed. No pertinent past medical history.  Past Surgical History:  Procedure Laterality Date  . ABDOMINOPLASTY  2003  . CESAREAN SECTION  11/2001   with bilateral tubal ligation  . TUBAL LIGATION      Family History  Problem Relation Age of Onset  . Osteoporosis Mother   . Breast cancer Maternal Grandmother 60  . Colon cancer Paternal Grandfather 6    Social History   Socioeconomic History  . Marital status: Married    Spouse name: Thayer Ohm  . Number of children: 5  . Years of education: 11  . Highest education level: Bachelor's degree (e.g., BA, AB, BS)  Occupational History  . Not on file  Social Needs  . Financial resource strain: Not on file  . Food insecurity    Worry: Not on file    Inability: Not on file  . Transportation needs    Medical: Not on file    Non-medical: Not on file  Tobacco Use  . Smoking status: Never Smoker  . Smokeless tobacco: Never Used  Substance and Sexual Activity  . Alcohol use: No  . Drug use: No  . Sexual activity: Yes  Birth control/protection: None  Lifestyle  . Physical activity    Days per week: 2 days    Minutes per session: 30 min  . Stress: Not at all  Relationships  . Social connections    Talks on phone: More than three times a week    Gets together: More than three times a week    Attends religious service: More than 4 times per year    Active member of club or organization: Yes    Attends meetings of clubs or organizations: More than 4 times per year     Relationship status: Married  . Intimate partner violence    Fear of current or ex partner: No    Emotionally abused: No    Physically abused: No    Forced sexual activity: No  Other Topics Concern  . Not on file  Social History Narrative  . Not on file    Objective:   Today's Vitals: BP 112/68   Pulse 78   Temp 98.4 F (36.9 C) (Temporal)   Ht 5\' 4"  (1.626 m)   Wt 170 lb 12.8 oz (77.5 kg)   LMP 08/27/2011   SpO2 98%   BMI 29.32 kg/m   Physical Exam Vitals signs reviewed.  Constitutional:      General: She is not in acute distress.    Appearance: Normal appearance. She is overweight. She is not ill-appearing, toxic-appearing or diaphoretic.  HENT:     Head: Normocephalic and atraumatic.     Right Ear: Tympanic membrane, ear canal and external ear normal. There is no impacted cerumen.     Left Ear: Tympanic membrane, ear canal and external ear normal. There is no impacted cerumen.     Nose: Nose normal. No congestion or rhinorrhea.     Mouth/Throat:     Mouth: Mucous membranes are moist.     Pharynx: Oropharynx is clear. No oropharyngeal exudate or posterior oropharyngeal erythema.  Eyes:     General: No scleral icterus.       Right eye: No discharge.        Left eye: No discharge.     Conjunctiva/sclera: Conjunctivae normal.     Pupils: Pupils are equal, round, and reactive to light.  Neck:     Musculoskeletal: Normal range of motion and neck supple. No neck rigidity or muscular tenderness.  Cardiovascular:     Rate and Rhythm: Normal rate and regular rhythm.     Heart sounds: Normal heart sounds. No murmur. No friction rub. No gallop.   Pulmonary:     Effort: Pulmonary effort is normal. No respiratory distress.     Breath sounds: Normal breath sounds. No stridor. No wheezing, rhonchi or rales.  Abdominal:     General: Abdomen is flat. Bowel sounds are normal. There is no distension.     Palpations: Abdomen is soft. There is no mass.     Tenderness: There is no  abdominal tenderness. There is no guarding or rebound.     Hernia: No hernia is present.  Musculoskeletal: Normal range of motion.  Lymphadenopathy:     Cervical: No cervical adenopathy.  Skin:    General: Skin is warm and dry.     Capillary Refill: Capillary refill takes less than 2 seconds.  Neurological:     General: No focal deficit present.     Mental Status: She is alert and oriented to person, place, and time. Mental status is at baseline.  Psychiatric:  Mood and Affect: Mood normal.        Behavior: Behavior normal.        Thought Content: Thought content normal.        Judgment: Judgment normal.

## 2019-01-24 LAB — COLOGUARD: Cologuard: NEGATIVE

## 2019-09-11 ENCOUNTER — Encounter: Payer: Self-pay | Admitting: Family Medicine

## 2019-09-11 MED ORDER — ALBUTEROL SULFATE HFA 108 (90 BASE) MCG/ACT IN AERS: 2.0000 | INHALATION_SPRAY | Freq: Four times a day (QID) | RESPIRATORY_TRACT | 2 refills | Status: AC | PRN

## 2020-04-07 ENCOUNTER — Telehealth: Payer: Self-pay
